# Patient Record
Sex: Female | Born: 1995
Health system: Southern US, Community
[De-identification: ages and names within clinical notes are randomized; demographics above are authoritative.]

---

## 2013-06-15 ENCOUNTER — Other Ambulatory Visit: Payer: Self-pay | Admitting: Allergy and Immunology

## 2013-06-15 ENCOUNTER — Ambulatory Visit
Admission: RE | Admit: 2013-06-15 | Discharge: 2013-06-15 | Disposition: A | Discharge status: Home / Self Care | Payer: BC Managed Care – PPO | Source: Ambulatory Visit | Attending: Allergy and Immunology | Admitting: Allergy and Immunology

## 2013-06-15 DIAGNOSIS — J329 Chronic sinusitis, unspecified: Secondary | ICD-10-CM

## 2013-06-15 DIAGNOSIS — R059 Cough, unspecified: Secondary | ICD-10-CM

## 2013-06-15 DIAGNOSIS — R05 Cough: Secondary | ICD-10-CM

## 2014-01-07 ENCOUNTER — Ambulatory Visit: Payer: Self-pay | Admitting: Unknown Physician Specialty

## 2014-05-23 LAB — SURGICAL PATHOLOGY

## 2015-04-05 ENCOUNTER — Other Ambulatory Visit: Payer: Self-pay | Admitting: Ophthalmology

## 2015-04-05 DIAGNOSIS — H538 Other visual disturbances: Secondary | ICD-10-CM

## 2015-04-05 DIAGNOSIS — R519 Headache, unspecified: Secondary | ICD-10-CM

## 2015-04-05 DIAGNOSIS — R51 Headache: Secondary | ICD-10-CM

## 2015-04-06 ENCOUNTER — Other Ambulatory Visit: Payer: Self-pay | Admitting: Ophthalmology

## 2015-04-06 ENCOUNTER — Ambulatory Visit
Admission: RE | Admit: 2015-04-06 | Discharge: 2015-04-06 | Disposition: A | Discharge status: Home / Self Care | Payer: BLUE CROSS/BLUE SHIELD | Source: Ambulatory Visit | Attending: Ophthalmology | Admitting: Ophthalmology

## 2015-04-06 ENCOUNTER — Ambulatory Visit: Admission: RE | Admit: 2015-04-06 | Payer: BLUE CROSS/BLUE SHIELD | Source: Ambulatory Visit

## 2015-04-06 DIAGNOSIS — R51 Headache: Secondary | ICD-10-CM

## 2015-04-06 DIAGNOSIS — R519 Headache, unspecified: Secondary | ICD-10-CM

## 2015-04-06 DIAGNOSIS — H538 Other visual disturbances: Secondary | ICD-10-CM

## 2015-04-06 MED ORDER — GADOBENATE DIMEGLUMINE 529 MG/ML IV SOLN
20.0000 mL | Freq: Once | INTRAVENOUS | Status: AC | PRN
  Administered 2015-04-06: 20 mL via INTRAVENOUS

## 2015-04-21 ENCOUNTER — Other Ambulatory Visit: Payer: Self-pay

## 2015-04-21 ENCOUNTER — Ambulatory Visit: Payer: Self-pay

## 2015-11-07 DIAGNOSIS — Z23 Encounter for immunization: Secondary | ICD-10-CM | POA: Diagnosis not present

## 2016-01-19 DIAGNOSIS — L700 Acne vulgaris: Secondary | ICD-10-CM | POA: Diagnosis not present

## 2016-01-19 DIAGNOSIS — Z3202 Encounter for pregnancy test, result negative: Secondary | ICD-10-CM | POA: Diagnosis not present

## 2016-07-07 IMAGING — MR MR HEAD WO/W CM
10 series · 42 of 48 positions shown · IV contrast (multihance)
Comparison: None available.

CLINICAL DATA: Initial evaluation for acute onset headache, blurry
vision.

EXAM:
MRI HEAD WITHOUT AND WITH CONTRAST
TECHNIQUE: Multiplanar, multiecho pulse sequences of the brain and surrounding
structures were obtained without and with intravenous contrast.
CONTRAST:  20mL MULTIHANCE GADOBENATE DIMEGLUMINE 529 MG/ML IV SOLN

[Series 2: t1_se_sag · sagittal · 5.0mm · 0.45mm/px · 2 of 21 slices shown]
[im 1/21]
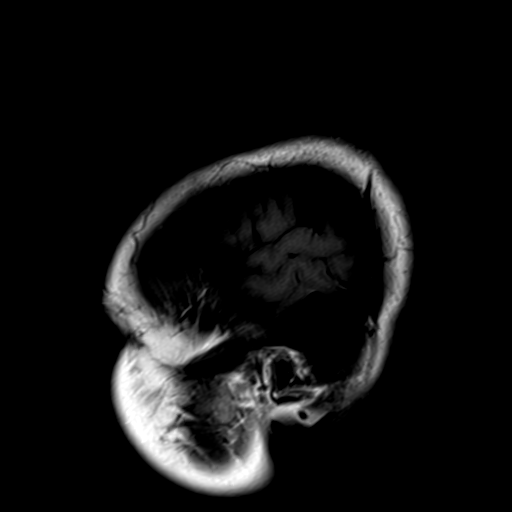
[im 21/21]
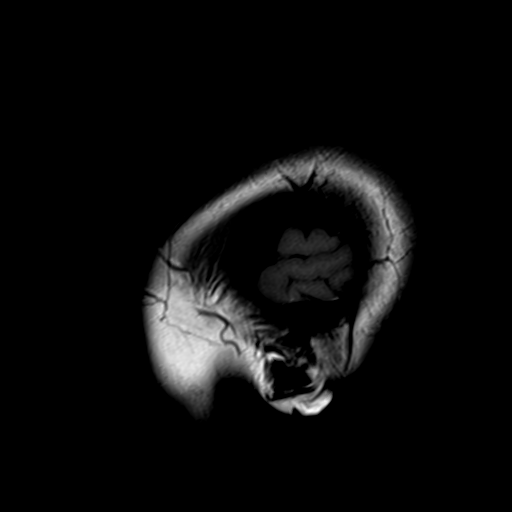

[Series 3: ep2d_diff_(id)_trace · axial · 3.0mm · 1.88mm/px · z∈[-83,+63]mm · 9 of 100 slices shown]
[im 1/100]
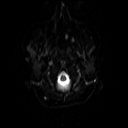
[im 13/100]
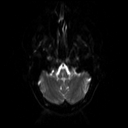
[im 25/100]
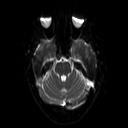
[im 38/100]
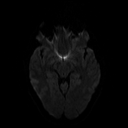
[im 50/100]
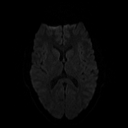
[im 62/100]
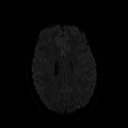
[im 75/100]
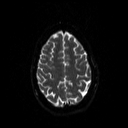
[im 87/100]
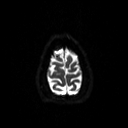
[im 100/100]
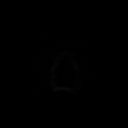

[Series 4: ep2d_diff_(id)_trace_adc · axial · 3.0mm · 1.88mm/px · z∈[-83,+63]mm · 5 of 50 slices shown]
[im 1/50]
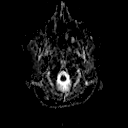
[im 13/50]
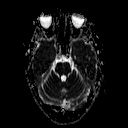
[im 25/50]
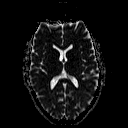
[im 37/50]
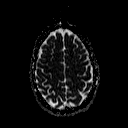
[im 50/50]
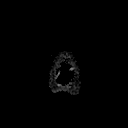

[Series 6: swi_images · axial · 2.0mm · 0.94mm/px · z∈[-88,+69]mm · 8 of 80 slices shown]
[im 1/80]
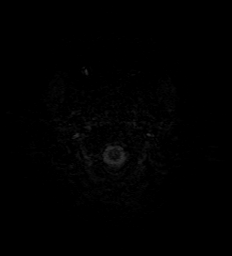
[im 12/80]
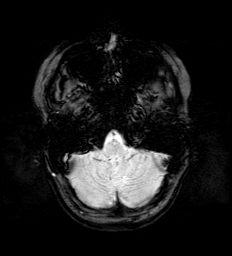
[im 23/80]
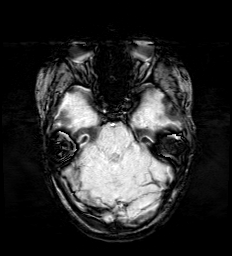
[im 34/80]
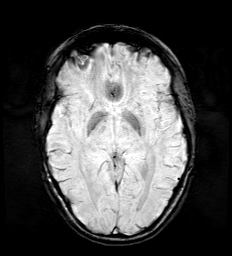
[im 46/80]
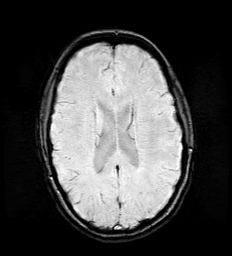
[im 57/80]
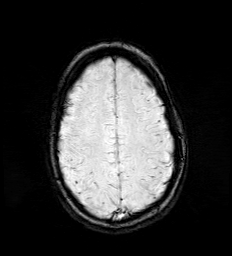
[im 68/80]
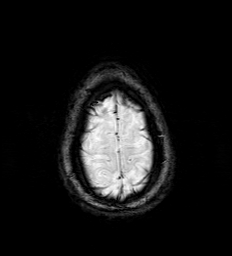
[im 80/80]
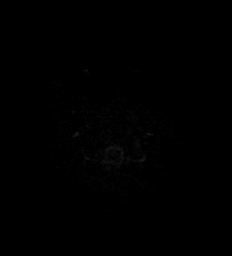

[Series 7: FLAIR · axial · 5.0mm · 0.47mm/px · z∈[-79,+59]mm · 2 of 23 slices shown]
[im 1/23]
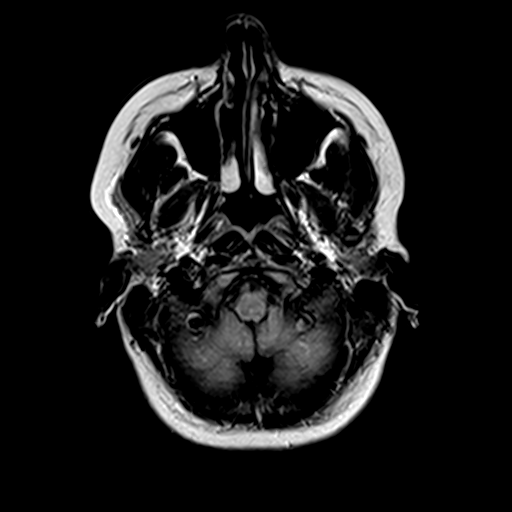
[im 23/23]
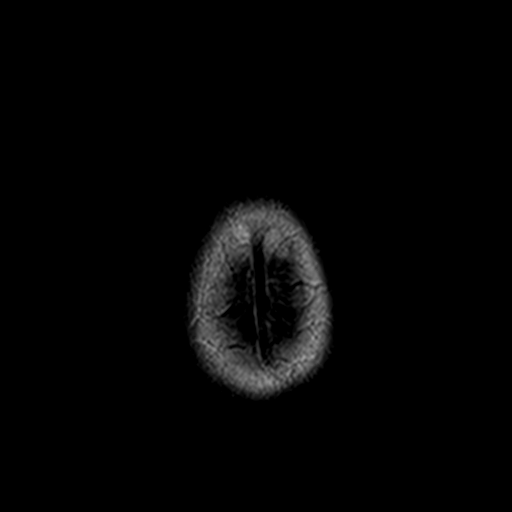

[Series 8: t2_tse_tra_512 · axial · 5.0mm · 0.62mm/px · z∈[-77,+60]mm · 2 of 23 slices shown]
[im 1/23]
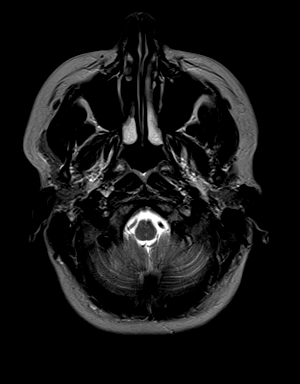
[im 23/23]
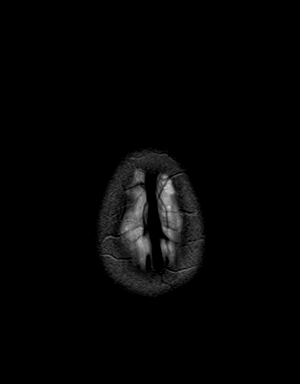

[Series 9: t1_mpr_tra · axial · 2.0mm · 0.47mm/px · z∈[-88,+70]mm · 8 of 80 slices shown]
[im 1/80]
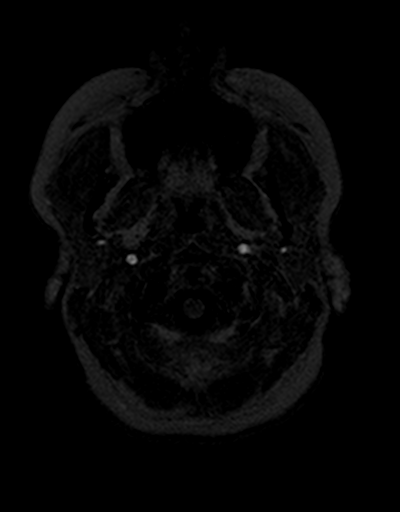
[im 12/80]
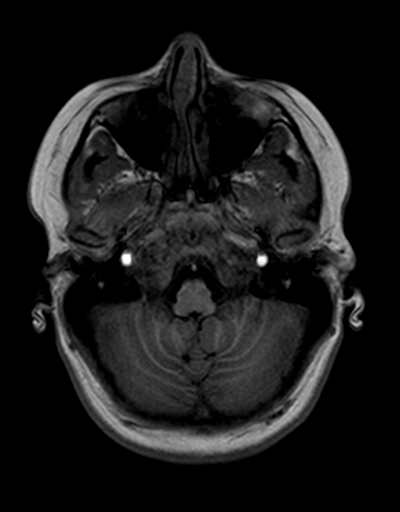
[im 23/80]
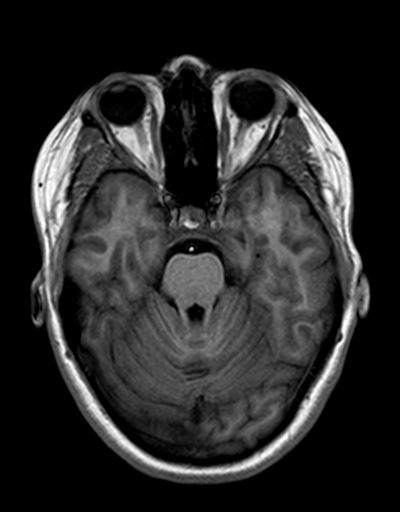
[im 34/80]
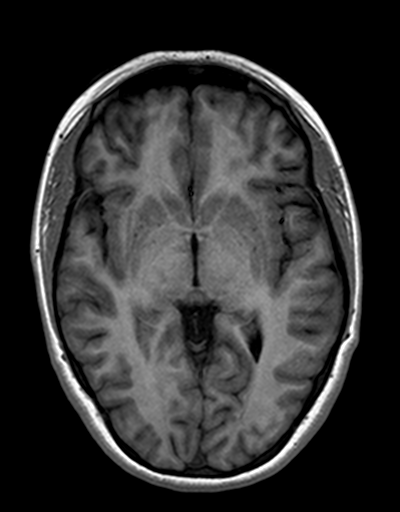
[im 46/80]
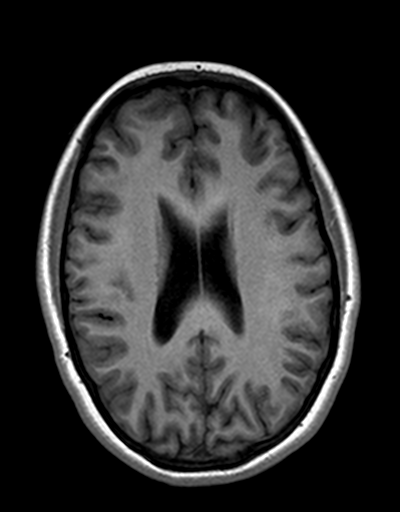
[im 57/80]
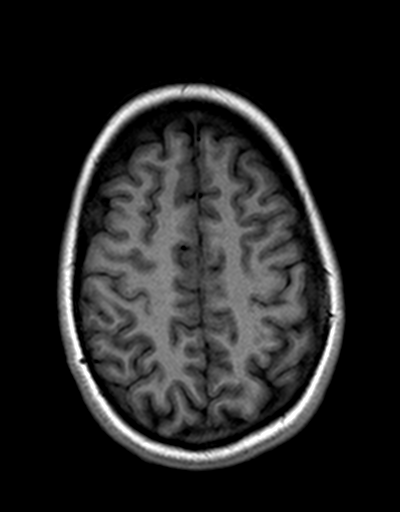
[im 68/80]
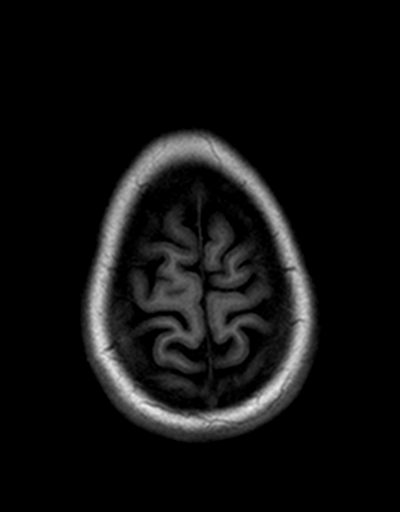
[im 80/80]
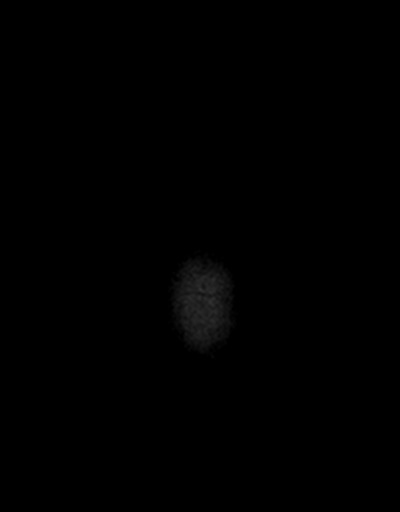

[Series 10: T2 · coronal · 5.0mm · 0.45mm/px · 2 of 25 slices shown]
[im 1/25]
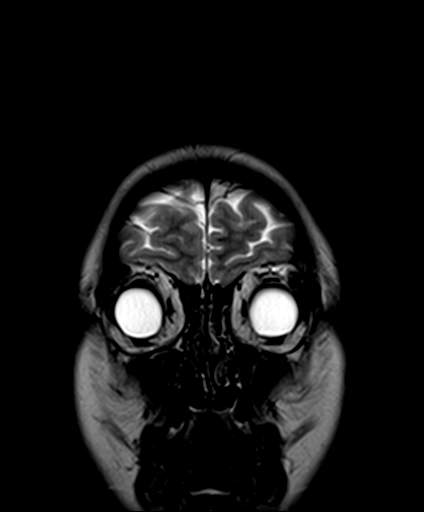
[im 25/25]
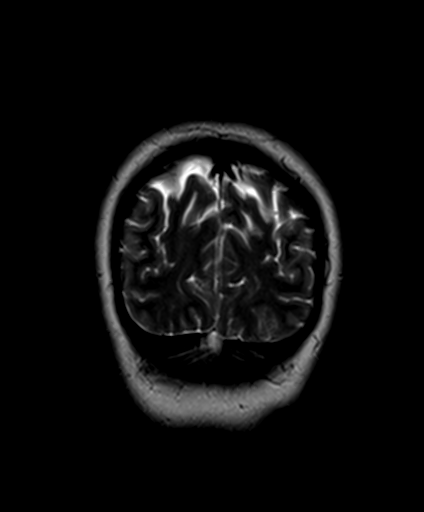

[Series 11: post cor · coronal · 5.0mm · 0.72mm/px · 2 of 25 slices shown]
[im 1/25]
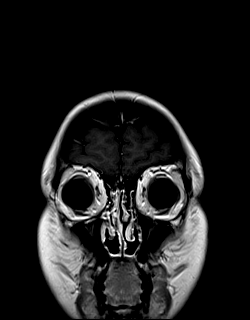
[im 25/25]
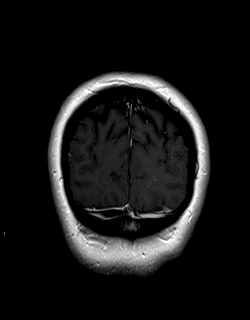

[Series 12: post t1_mpr_tra · axial · 2.0mm · 0.47mm/px · z∈[-88,-66]mm · 2 of 80 slices shown]
[im 1/80]
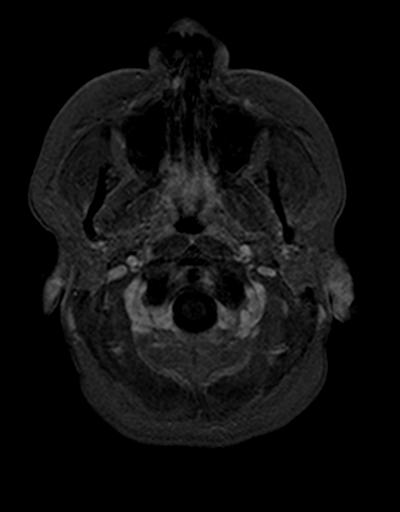
[im 12/80]
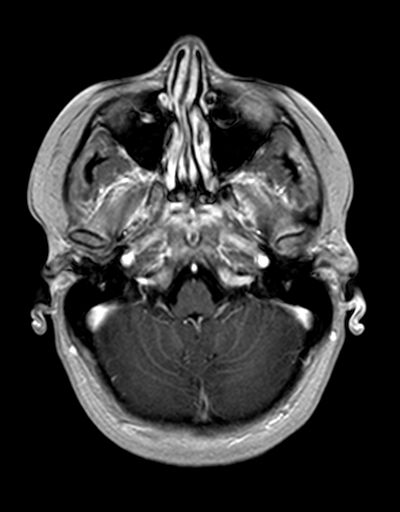

[42 of 48 positions shown; findings below may reference images not displayed]

FINDINGS: The CSF containing spaces are within normal limits for patient age.
No focal parenchymal signal abnormality is identified. No mass
lesion, midline shift, or extra-axial fluid collection. Ventricles
are normal in size without evidence of hydrocephalus.

No diffusion-weighted signal abnormality is identified to suggest
acute intracranial infarct. Gray-white matter differentiation is
maintained. Normal flow voids are seen within the intracranial
vasculature. No intracranial hemorrhage identified.

The cervicomedullary junction is normal. Pituitary gland is within
normal limits. Pituitary stalk is midline. The globes and optic
nerves demonstrate a normal appearance with normal signal intensity.

No abnormal enhancement.

The bone marrow signal intensity is normal. Calvarium is intact.
Visualized upper cervical spine is within normal limits.

Scalp soft tissues are unremarkable.

Mild polypoid mucosal thickening within the inferior maxillary
sinuses. Paranasal sinuses are otherwise clear. No mastoid effusion.
Inner ear structures normal.
IMPRESSION: Normal MRI of the brain.

## 2016-12-25 DIAGNOSIS — L700 Acne vulgaris: Secondary | ICD-10-CM | POA: Diagnosis not present

## 2016-12-26 ENCOUNTER — Encounter: Payer: Self-pay | Admitting: Internal Medicine

## 2016-12-26 ENCOUNTER — Ambulatory Visit: Payer: BLUE CROSS/BLUE SHIELD | Admitting: Internal Medicine

## 2016-12-26 DIAGNOSIS — Z0000 Encounter for general adult medical examination without abnormal findings: Secondary | ICD-10-CM | POA: Diagnosis not present

## 2016-12-26 DIAGNOSIS — Z920 Personal history of contraception: Secondary | ICD-10-CM

## 2016-12-26 NOTE — Patient Instructions (Signed)
Good to meet you! Don't forget to send me a copy of the labs your office did recently  I'll see you in a year,  Sooner if needed   Health Maintenance, Female Adopting a healthy lifestyle and getting preventive care can go a long way to promote health and wellness. Talk with your health care provider about what schedule of regular examinations is right for you. This is a good chance for you to check in with your provider about disease prevention and staying healthy. In between checkups, there are plenty of things you can do on your own. Experts have done a lot of research about which lifestyle changes and preventive measures are most likely to keep you healthy. Ask your health care provider for more information. Weight and diet Eat a healthy diet  Be sure to include plenty of vegetables, fruits, low-fat dairy products, and lean protein.  Do not eat a lot of foods high in solid fats, added sugars, or salt.  Get regular exercise. This is one of the most important things you can do for your health. ? Most adults should exercise for at least 150 minutes each week. The exercise should increase your heart rate and make you sweat (moderate-intensity exercise). ? Most adults should also do strengthening exercises at least twice a week. This is in addition to the moderate-intensity exercise.  Maintain a healthy weight  Body mass index (BMI) is a measurement that can be used to identify possible weight problems. It estimates body fat based on height and weight. Your health care provider can help determine your BMI and help you achieve or maintain a healthy weight.  For females 64 years of age and older: ? A BMI below 18.5 is considered underweight. ? A BMI of 18.5 to 24.9 is normal. ? A BMI of 25 to 29.9 is considered overweight. ? A BMI of 30 and above is considered obese.  Watch levels of cholesterol and blood lipids  You should start having your blood tested for lipids and cholesterol at 22  years of age, then have this test every 5 years.  You may need to have your cholesterol levels checked more often if: ? Your lipid or cholesterol levels are high. ? You are older than 23 years of age. ? You are at high risk for heart disease.  Cancer screening Lung Cancer  Lung cancer screening is recommended for adults 72-5 years old who are at high risk for lung cancer because of a history of smoking.  A yearly low-dose CT scan of the lungs is recommended for people who: ? Currently smoke. ? Have quit within the past 15 years. ? Have at least a 30-pack-year history of smoking. A pack year is smoking an average of one pack of cigarettes a day for 1 year.  Yearly screening should continue until it has been 15 years since you quit.  Yearly screening should stop if you develop a health problem that would prevent you from having lung cancer treatment.  Breast Cancer  Practice breast self-awareness. This means understanding how your breasts normally appear and feel.  It also means doing regular breast self-exams. Let your health care provider know about any changes, no matter how small.  If you are in your 20s or 30s, you should have a clinical breast exam (CBE) by a health care provider every 1-3 years as part of a regular health exam.  If you are 8 or older, have a CBE every year. Also consider having a  breast X-ray (mammogram) every year.  If you have a family history of breast cancer, talk to your health care provider about genetic screening.  If you are at high risk for breast cancer, talk to your health care provider about having an MRI and a mammogram every year.  Breast cancer gene (BRCA) assessment is recommended for women who have family members with BRCA-related cancers. BRCA-related cancers include: ? Breast. ? Ovarian. ? Tubal. ? Peritoneal cancers.  Results of the assessment will determine the need for genetic counseling and BRCA1 and BRCA2 testing.  Cervical  Cancer Your health care provider may recommend that you be screened regularly for cancer of the pelvic organs (ovaries, uterus, and vagina). This screening involves a pelvic examination, including checking for microscopic changes to the surface of your cervix (Pap test). You may be encouraged to have this screening done every 3 years, beginning at age 103.  For women ages 32-65, health care providers may recommend pelvic exams and Pap testing every 3 years, or they may recommend the Pap and pelvic exam, combined with testing for human papilloma virus (HPV), every 5 years. Some types of HPV increase your risk of cervical cancer. Testing for HPV may also be done on women of any age with unclear Pap test results.  Other health care providers may not recommend any screening for nonpregnant women who are considered low risk for pelvic cancer and who do not have symptoms. Ask your health care provider if a screening pelvic exam is right for you.  If you have had past treatment for cervical cancer or a condition that could lead to cancer, you need Pap tests and screening for cancer for at least 20 years after your treatment. If Pap tests have been discontinued, your risk factors (such as having a new sexual partner) need to be reassessed to determine if screening should resume. Some women have medical problems that increase the chance of getting cervical cancer. In these cases, your health care provider may recommend more frequent screening and Pap tests.  Colorectal Cancer  This type of cancer can be detected and often prevented.  Routine colorectal cancer screening usually begins at 23 years of age and continues through 23 years of age.  Your health care provider may recommend screening at an earlier age if you have risk factors for colon cancer.  Your health care provider may also recommend using home test kits to check for hidden blood in the stool.  A small camera at the end of a tube can be used to  examine your colon directly (sigmoidoscopy or colonoscopy). This is done to check for the earliest forms of colorectal cancer.  Routine screening usually begins at age 65.  Direct examination of the colon should be repeated every 5-10 years through 23 years of age. However, you may need to be screened more often if early forms of precancerous polyps or small growths are found.  Skin Cancer  Check your skin from head to toe regularly.  Tell your health care provider about any new moles or changes in moles, especially if there is a change in a mole's shape or color.  Also tell your health care provider if you have a mole that is larger than the size of a pencil eraser.  Always use sunscreen. Apply sunscreen liberally and repeatedly throughout the day.  Protect yourself by wearing long sleeves, pants, a wide-brimmed hat, and sunglasses whenever you are outside.  Heart disease, diabetes, and high blood pressure  High  blood pressure causes heart disease and increases the risk of stroke. High blood pressure is more likely to develop in: ? People who have blood pressure in the high end of the normal range (130-139/85-89 mm Hg). ? People who are overweight or obese. ? People who are African American.  If you are 30-64 years of age, have your blood pressure checked every 3-5 years. If you are 29 years of age or older, have your blood pressure checked every year. You should have your blood pressure measured twice-once when you are at a hospital or clinic, and once when you are not at a hospital or clinic. Record the average of the two measurements. To check your blood pressure when you are not at a hospital or clinic, you can use: ? An automated blood pressure machine at a pharmacy. ? A home blood pressure monitor.  If you are between 37 years and 32 years old, ask your health care provider if you should take aspirin to prevent strokes.  Have regular diabetes screenings. This involves taking a  blood sample to check your fasting blood sugar level. ? If you are at a normal weight and have a low risk for diabetes, have this test once every three years after 23 years of age. ? If you are overweight and have a high risk for diabetes, consider being tested at a younger age or more often. Preventing infection Hepatitis B  If you have a higher risk for hepatitis B, you should be screened for this virus. You are considered at high risk for hepatitis B if: ? You were born in a country where hepatitis B is common. Ask your health care provider which countries are considered high risk. ? Your parents were born in a high-risk country, and you have not been immunized against hepatitis B (hepatitis B vaccine). ? You have HIV or AIDS. ? You use needles to inject street drugs. ? You live with someone who has hepatitis B. ? You have had sex with someone who has hepatitis B. ? You get hemodialysis treatment. ? You take certain medicines for conditions, including cancer, organ transplantation, and autoimmune conditions.  Hepatitis C  Blood testing is recommended for: ? Everyone born from [birth year] through 1967. ? Anyone with known risk factors for hepatitis C.  Sexually transmitted infections (STIs)  You should be screened for sexually transmitted infections (STIs) including gonorrhea and chlamydia if: ? You are sexually active and are younger than 23 years of age. ? You are older than 23 years of age and your health care provider tells you that you are at risk for this type of infection. ? Your sexual activity has changed since you were last screened and you are at an increased risk for chlamydia or gonorrhea. Ask your health care provider if you are at risk.  If you do not have HIV, but are at risk, it may be recommended that you take a prescription medicine daily to prevent HIV infection. This is called pre-exposure prophylaxis (PrEP). You are considered at risk if: ? You are sexually active and  do not regularly use condoms or know the HIV status of your partner(s). ? You take drugs by injection. ? You are sexually active with a partner who has HIV.  Talk with your health care provider about whether you are at high risk of being infected with HIV. If you choose to begin PrEP, you should first be tested for HIV. You should then be tested every 3 months for  as long as you are taking PrEP. Pregnancy  If you are premenopausal and you may become pregnant, ask your health care provider about preconception counseling.  If you may become pregnant, take 400 to 800 micrograms (mcg) of folic acid every day.  If you want to prevent pregnancy, talk to your health care provider about birth control (contraception). Osteoporosis and menopause  Osteoporosis is a disease in which the bones lose minerals and strength with aging. This can result in serious bone fractures. Your risk for osteoporosis can be identified using a bone density scan.  If you are 56 years of age or older, or if you are at risk for osteoporosis and fractures, ask your health care provider if you should be screened.  Ask your health care provider whether you should take a calcium or vitamin D supplement to lower your risk for osteoporosis.  Menopause may have certain physical symptoms and risks.  Hormone replacement therapy may reduce some of these symptoms and risks. Talk to your health care provider about whether hormone replacement therapy is right for you. Follow these instructions at home:  Schedule regular health, dental, and eye exams.  Stay current with your immunizations.  Do not use any tobacco products including cigarettes, chewing tobacco, or electronic cigarettes.  If you are pregnant, do not drink alcohol.  If you are breastfeeding, limit how much and how often you drink alcohol.  Limit alcohol intake to no more than 1 drink per day for nonpregnant women. One drink equals 12 ounces of beer, 5 ounces of  wine, or 1 ounces of hard liquor.  Do not use street drugs.  Do not share needles.  Ask your health care provider for help if you need support or information about quitting drugs.  Tell your health care provider if you often feel depressed.  Tell your health care provider if you have ever been abused or do not feel safe at home. This information is not intended to replace advice given to you by your health care provider. Make sure you discuss any questions you have with your health care provider. Document Released: 07/30/2010 Document Revised: 06/22/2015 Document Reviewed: 10/18/2014 Elsevier Interactive Patient Education  [employer].

## 2016-12-26 NOTE — Progress Notes (Signed)
Subjective:  Patient ID: Barbara Barker, female    DOB: 1995/09/15  Age: 21 y.o. MRN: [id number]  CC: Diagnoses of History of oral contraceptive use and Encounter for preventive health examination were pertinent to this visit.  HPI Barbara Jabsnne K Croston presents for ESTABLISHMENT OF CARE and ANNUAL PREVENTIVE EXAM.  .  She was referred by Hyman Bowereena Koury.  She has no complaints today.    She has gained weight since graduation from college and has joined [employer] within hte last week for management .  Diet and need for regular exercise reviewed.   She takes birth control for management of acne and is not sexually active    History of Ophthalmology workup in  March 2017 for evaluation of  Unilateral visual disturbance.  Had MRI  Brain was normal,  Normal retinal exam.    Declines flu vaccine   History Thurston Holenne has no past medical history on file.   She has no past surgical history on file.   Her family history includes Arthritis in her maternal grandmother, mother, and paternal grandmother; Colon cancer in her other; Diabetes in her other and other; Heart disease in her paternal grandfather; Hyperlipidemia in her father; Hypertension in her maternal grandmother.She reports that  has never smoked. she has never used smokeless tobacco. She reports that she does not drink alcohol or use drugs.  Outpatient Medications Prior to Visit  Medication Sig Dispense Refill  . spironolactone (ALDACTONE) 50 MG tablet   4  . TRI-SPRINTEC 0.18/0.215/0.25 MG-35 MCG tablet   0   No facility-administered medications prior to visit.     Review of Systems:  Patient denies headache, fevers, malaise, unintentional weight loss, skin rash, eye pain, sinus congestion and sinus pain, sore throat, dysphagia,  hemoptysis , cough, dyspnea, wheezing, chest pain, palpitations, orthopnea, edema, abdominal pain, nausea, melena, diarrhea, constipation, flank pain, dysuria, hematuria, urinary  Frequency, nocturia, numbness,  tingling, seizures,  Focal weakness, Loss of consciousness,  Tremor, insomnia, depression, anxiety, and suicidal ideation.     Objective:  BP 116/78 (BP Location: Left Arm, Patient Position: Sitting, Cuff Size: Normal)   Pulse 68   Temp 98.3 F (36.8 C) (Oral)   Resp 17   Ht 5' 9.5" (1.765 m)   Wt 207 lb (93.9 kg)   SpO2 98%   BMI 30.13 kg/m   Physical Exam:  General appearance: alert, cooperative and appears stated age Ears: normal TM's and external ear canals both ears Throat: lips, mucosa, and tongue normal; teeth and gums normal Neck: no adenopathy, no carotid bruit, supple, symmetrical, trachea midline and thyroid not enlarged, symmetric, no tenderness/mass/nodules Back: symmetric, no curvature. ROM normal. No CVA tenderness. Lungs: clear to auscultation bilaterally Heart: regular rate and rhythm, S1, S2 normal, no murmur, click, rub or gallop Abdomen: soft, non-tender; bowel sounds normal; no masses,  no organomegaly Pulses: 2+ and symmetric Skin: Skin color, texture, turgor normal. No rashes or lesions Lymph nodes: Cervical, supraclavicular, and axillary nodes normal.   Assessment & Plan:   Problem List Items Addressed This Visit    Encounter for preventive health examination    Annual comprehensive exam was done  During initial visit .  During the course of the visit the patient was educated and counseled about appropriate screening and preventive services and screenings were brought up to date for cervical and breast cancer .  She  Is not sexually active and has no FH of breast cancer.  She has had recent  fasting labs  which will be provided and reviewed.  Labs included (per patient) diabetes screening and lipid analysis ,  nutrition counseling, skin cancer screening has been recommended, along with review of the age appropriate recommended immunizations.  Printed recommendations for health maintenance screenings was given.        History of oral contraceptive use     Patient screened for history of DVTs, tobacco abuse.  Use of pill reviewed  and need for annual monitoring of liver enzyes as well as barrier protection to prevent STDS, and for prevention of contraception  during periods of concurrent use of antibiotics.  No results found for: ALT, AST, GGT, ALKPHOS, BILITOT           I am having Davie K. Rubye OaksDailey maintain her TRI-SPRINTEC and spironolactone.  No orders of the defined types were placed in this encounter.   There are no discontinued medications.  Follow-up: No Follow-up on file.   Sherlene Shamseresa L Tullo, MD

## 2016-12-29 ENCOUNTER — Encounter: Payer: Self-pay | Admitting: Internal Medicine

## 2016-12-29 DIAGNOSIS — Z920 Personal history of contraception: Secondary | ICD-10-CM | POA: Insufficient documentation

## 2016-12-29 DIAGNOSIS — Z0000 Encounter for general adult medical examination without abnormal findings: Secondary | ICD-10-CM | POA: Insufficient documentation

## 2016-12-29 NOTE — Assessment & Plan Note (Addendum)
Patient screened for history of DVTs, tobacco abuse.  Use of pill reviewed  and need for annual monitoring of liver enzyes as well as barrier protection to prevent STDS, and for prevention of contraception  during periods of concurrent use of antibiotics.  No results found for: ALT, AST, GGT, ALKPHOS, BILITOT

## 2016-12-29 NOTE — Assessment & Plan Note (Signed)
Annual comprehensive exam was done  During initial visit .  During the course of the visit the patient was educated and counseled about appropriate screening and preventive services and screenings were brought up to date for cervical and breast cancer .  She  Is not sexually active and has no FH of breast cancer.  She has had recent  fasting labs which will be provided and reviewed.  Labs included (per patient) diabetes screening and lipid analysis ,  nutrition counseling, skin cancer screening has been recommended, along with review of the age appropriate recommended immunizations.  Printed recommendations for health maintenance screenings was given.

## 2017-10-06 ENCOUNTER — Telehealth: Payer: Self-pay

## 2017-10-06 NOTE — Telephone Encounter (Signed)
Left detailed message per DPR that patient will need an appointment to clarify need for standup desk, patient has only been seen once and over 8 months ago.

## 2017-10-06 NOTE — Telephone Encounter (Signed)
Pt would a like a letter from PCP so she can get a stand up desk at work.

## 2017-10-06 NOTE — Telephone Encounter (Signed)
Copied from CRM [id number]. Topic: Inquiry >> Oct 06, 2017  1:34 PM Alexander Bergeron B wrote: Reason for CRM: pt called b/c she is needing a note from her pcp to show her employer in order for her to get a standup desk; contact to advise

## 2017-11-19 ENCOUNTER — Encounter: Payer: Self-pay | Admitting: Internal Medicine

## 2017-11-19 ENCOUNTER — Ambulatory Visit: Payer: BLUE CROSS/BLUE SHIELD | Admitting: Internal Medicine

## 2017-11-19 VITALS — BP 110/68 | HR 57 | Temp 98.1°F | Resp 14 | Ht 69.25 in | Wt 193.0 lb

## 2017-11-19 DIAGNOSIS — Z920 Personal history of contraception: Secondary | ICD-10-CM | POA: Diagnosis not present

## 2017-11-19 DIAGNOSIS — Z23 Encounter for immunization: Secondary | ICD-10-CM | POA: Diagnosis not present

## 2017-11-19 DIAGNOSIS — E663 Overweight: Secondary | ICD-10-CM

## 2017-11-19 DIAGNOSIS — R5383 Other fatigue: Secondary | ICD-10-CM | POA: Diagnosis not present

## 2017-11-19 MED ORDER — TRI-SPRINTEC 0.18/0.215/0.25 MG-35 MCG PO TABS: 1.0000 | ORAL_TABLET | Freq: Every day | ORAL | 11 refills | Status: DC

## 2017-11-19 NOTE — Progress Notes (Signed)
Patient ID: Barbara Barker, female    DOB: 1996/01/27  Age: 22 y.o. MRN: [id number]  The patient is here for annual  wellness examination and management of other chronic and acute problems.   The risk factors are reflected in the social history.  The roster of all physicians providing medical care to patient - is listed in the Snapshot section of the chart.  Activities of daily living:  The patient is 100% independent in all ADLs: dressing, toileting, feeding as well as independent mobility  Home safety : The patient has smoke detectors in the home. They wear seatbelts.  There are no firearms at home. There is no violence in the home.   There is no risks for hepatitis, STDs or HIV. There is no   history of blood transfusion. They have no travel history to infectious disease endemic areas of the world.  The patient has seen their dentist in the last six month. They have seen their eye doctor in the last year.   They do not  have excessive sun exposure. Discussed the need for sun protection: hats, long sleeves and use of sunscreen if there is significant sun exposure.   Diet: the importance of a healthy diet is discussed. They do have a healthy diet.  The benefits of regular aerobic exercise were discussed. She is not exercising regularly .   Depression screen: there are no signs or vegative symptoms of depression- irritability, change in appetite, anhedonia, sadness/tearfullness.   The following portions of the patient's history were reviewed and updated as appropriate: allergies, current medications, past family history, past medical history,  past surgical history, past social history  and problem list.  Visual acuity was not assessed per patient preference since she has regular follow up with her ophthalmologist. Hearing and body mass index were assessed and reviewed.   During the course of the visit the patient was educated and counseled about appropriate screening and preventive  services including :  nutrition counseling,  and recommended immunizations.    She is not sexually active.   CC: The primary encounter diagnosis was Fatigue, unspecified type. Diagnoses of Need for immunization against influenza, Overweight, History of oral contraceptive use, and Overweight (BMI 25.0-29.9) were also pertinent to this visit.  1) request for a Veri Desk  2) refill on Tri Sprintec which she is using for management of acne.   History Barbara Barker has no past medical history on file.   She has no past surgical history on file.   Her family history includes Arthritis in her maternal grandmother, mother, and paternal grandmother; Colon cancer in her other; Dementia in her maternal grandfather; Diabetes in her other and other; Heart disease in her paternal grandfather; Hyperlipidemia in her father; Hypertension in her maternal grandmother.She reports that she has never smoked. She has never used smokeless tobacco. She reports that she drinks about 1.0 standard drinks of alcohol per week. She reports that she does not use drugs.  Outpatient Medications Prior to Visit  Medication Sig Dispense Refill  . TRI-SPRINTEC 0.18/0.215/0.25 MG-35 MCG tablet   0  . spironolactone (ALDACTONE) 50 MG tablet   4   No facility-administered medications prior to visit.     Review of Systems   Patient denies headache, fevers, malaise, unintentional weight loss, skin rash, eye pain, sinus congestion and sinus pain, sore throat, dysphagia,  hemoptysis , cough, dyspnea, wheezing, chest pain, palpitations, orthopnea, edema, abdominal pain, nausea, melena, diarrhea, constipation, flank pain, dysuria, hematuria, urinary  Frequency,  nocturia, numbness, tingling, seizures,  Focal weakness, Loss of consciousness,  Tremor, insomnia, depression, anxiety, and suicidal ideation.      Objective:  BP 110/68 (BP Location: Left Arm, Patient Position: Sitting, Cuff Size: Normal)   Pulse (!) 57   Temp 98.1 F (36.7 C)  (Oral)   Resp 14   Ht 5' 9.25" (1.759 m)   Wt 193 lb (87.5 kg)   SpO2 98%   BMI 28.30 kg/m   Physical Exam   General appearance: alert, cooperative and appears stated age Ears: normal TM's and external ear canals both ears Throat: lips, mucosa, and tongue normal; teeth and gums normal Neck: no adenopathy, no carotid bruit, supple, symmetrical, trachea midline and thyroid not enlarged, symmetric, no tenderness/mass/nodules Back: symmetric, no curvature. ROM normal. No CVA tenderness. Lungs: clear to auscultation bilaterally Heart: regular rate and rhythm, S1, S2 normal, no murmur, click, rub or gallop Abdomen: soft, non-tender; bowel sounds normal; no masses,  no organomegaly Pulses: 2+ and symmetric Skin: Skin color, texture, turgor normal. No rashes or lesions Lymph nodes: Cervical, supraclavicular, and axillary nodes normal.   Assessment & Plan:   Problem List Items Addressed This Visit    History of oral contraceptive use    Patient screened for history of DVTs, tobacco abuse.  Use of pill reviewed  and need for annual monitoring of liver enzyes as well as barrier protection to prevent STDS, and for prevention of contraception  during periods of concurrent use of antibiotics.       Overweight (BMI 25.0-29.9)    I have congratulated her in reduction of   BMI and encouraged  Continued weight loss with goal of 10% of body weigh over the next 6 months using a low glycemic index diet and regular exercise a minimum of 5 days per week.         Other Visit Diagnoses    Fatigue, unspecified type    -  Primary   Relevant Orders   Comprehensive metabolic panel   TSH   CBC with Differential/Platelet   Need for immunization against influenza       Relevant Orders   Flu Vaccine QUAD 36+ mos IM (Completed)   Overweight       Relevant Orders   Hemoglobin A1c   Lipid panel     A total of 25 minutes of face to face time was spent with patient more than half of which was spent in  counselling about the above mentioned conditions  and coordination of care    I have discontinued Liliana K. Arcia's spironolactone. I have also changed her TRI-SPRINTEC.  Meds ordered this encounter  Medications  . TRI-SPRINTEC 0.18/0.215/0.25 MG-35 MCG tablet    Sig: Take 1 tablet by mouth daily.    Dispense:  1 Package    Refill:  11    Medications Discontinued During This Encounter  Medication Reason  . spironolactone (ALDACTONE) 50 MG tablet Patient has not taken in last 30 days  . TRI-SPRINTEC 0.18/0.215/0.25 MG-35 MCG tablet Reorder    Follow-up: No follow-ups on file.   Sherlene Shams, MD

## 2017-11-19 NOTE — Assessment & Plan Note (Signed)
Patient screened for history of DVTs, tobacco abuse.  Use of pill reviewed  and need for annual monitoring of liver enzyes as well as barrier protection to prevent STDS, and for prevention of contraception  during periods of concurrent use of antibiotics.

## 2017-11-19 NOTE — Patient Instructions (Signed)

## 2017-11-19 NOTE — Assessment & Plan Note (Signed)
I have congratulated her in reduction of   BMI and encouraged  Continued weight loss with goal of 10% of body weigh over the next 6 months using a low glycemic index diet and regular exercise a minimum of 5 days per week.    

## 2017-11-20 LAB — LIPID PANEL
CHOL/HDL RATIO: 2
Cholesterol: 149 mg/dL (ref 0–200)
HDL: 62.3 mg/dL (ref >39.00)
LDL CALC: 74 mg/dL (ref 0–99)
NONHDL: 86.76
Triglycerides: 65 mg/dL (ref 0.0–149.0)
VLDL: 13 mg/dL (ref 0.0–40.0)

## 2017-11-20 LAB — CBC WITH DIFFERENTIAL/PLATELET
Basophils Absolute: 0.1 10*3/uL (ref 0.0–0.1)
Basophils Relative: 1 % (ref 0.0–3.0)
EOS ABS: 0.1 10*3/uL (ref 0.0–0.7)
EOS PCT: 0.9 % (ref 0.0–5.0)
HEMATOCRIT: 41.1 % (ref 36.0–46.0)
HEMOGLOBIN: 14.4 g/dL (ref 12.0–15.0)
LYMPHS PCT: 34.9 % (ref 12.0–46.0)
Lymphs Abs: 2.9 10*3/uL (ref 0.7–4.0)
MCHC: 34.9 g/dL (ref 30.0–36.0)
MCV: 94.5 fl (ref 78.0–100.0)
Monocytes Absolute: 0.5 10*3/uL (ref 0.1–1.0)
Monocytes Relative: 5.4 % (ref 3.0–12.0)
NEUTROS ABS: 4.8 10*3/uL (ref 1.4–7.7)
Neutrophils Relative %: 57.8 % (ref 43.0–77.0)
PLATELETS: 294 10*3/uL (ref 150.0–400.0)
RBC: 4.35 Mil/uL (ref 3.87–5.11)
RDW: 11.6 % (ref 11.5–15.5)
WBC: 8.3 10*3/uL (ref 4.0–10.5)

## 2017-11-20 LAB — COMPREHENSIVE METABOLIC PANEL
ALT: 15 U/L (ref 0–35)
AST: 19 U/L (ref 0–37)
Albumin: 4.7 g/dL (ref 3.5–5.2)
Alkaline Phosphatase: 54 U/L (ref 39–117)
BILIRUBIN TOTAL: 0.8 mg/dL (ref 0.2–1.2)
BUN: 12 mg/dL (ref 6–23)
CO2: 23 mEq/L (ref 19–32)
Calcium: 9.6 mg/dL (ref 8.4–10.5)
Chloride: 104 mEq/L (ref 96–112)
Creatinine, Ser: 0.8 mg/dL (ref 0.40–1.20)
GFR: 93.68 mL/min (ref >60.00)
GLUCOSE: 65 mg/dL — AB (ref 70–99)
Potassium: 3.9 mEq/L (ref 3.5–5.1)
Sodium: 139 mEq/L (ref 135–145)
Total Protein: 7.4 g/dL (ref 6.0–8.3)

## 2017-11-20 LAB — HEMOGLOBIN A1C: Hgb A1c MFr Bld: 5.2 % (ref 4.6–6.5)

## 2017-11-20 LAB — TSH: TSH: 1.3 u[IU]/mL (ref 0.35–4.50)

## 2018-08-10 DIAGNOSIS — L700 Acne vulgaris: Secondary | ICD-10-CM | POA: Diagnosis not present

## 2018-09-25 ENCOUNTER — Other Ambulatory Visit: Payer: Self-pay | Admitting: Internal Medicine

## 2019-02-26 DIAGNOSIS — F4323 Adjustment disorder with mixed anxiety and depressed mood: Secondary | ICD-10-CM | POA: Diagnosis not present

## 2019-03-17 DIAGNOSIS — F4323 Adjustment disorder with mixed anxiety and depressed mood: Secondary | ICD-10-CM | POA: Diagnosis not present

## 2019-07-26 ENCOUNTER — Encounter: Payer: Self-pay | Admitting: Internal Medicine

## 2019-07-26 ENCOUNTER — Other Ambulatory Visit (HOSPITAL_COMMUNITY)
Admission: RE | Admit: 2019-07-26 | Discharge: 2019-07-26 | Disposition: A | Discharge status: Home / Self Care | Payer: BC Managed Care – PPO | Source: Ambulatory Visit | Attending: Internal Medicine | Admitting: Internal Medicine

## 2019-07-26 ENCOUNTER — Other Ambulatory Visit: Payer: Self-pay

## 2019-07-26 ENCOUNTER — Ambulatory Visit (INDEPENDENT_AMBULATORY_CARE_PROVIDER_SITE_OTHER): Payer: BC Managed Care – PPO | Admitting: Internal Medicine

## 2019-07-26 VITALS — BP 106/72 | HR 88 | Temp 98.5°F | Resp 14 | Ht 69.25 in | Wt 187.0 lb

## 2019-07-26 DIAGNOSIS — Z0000 Encounter for general adult medical examination without abnormal findings: Secondary | ICD-10-CM

## 2019-07-26 DIAGNOSIS — Z920 Personal history of contraception: Secondary | ICD-10-CM | POA: Diagnosis not present

## 2019-07-26 DIAGNOSIS — Z124 Encounter for screening for malignant neoplasm of cervix: Secondary | ICD-10-CM | POA: Diagnosis not present

## 2019-07-26 DIAGNOSIS — E663 Overweight: Secondary | ICD-10-CM | POA: Diagnosis not present

## 2019-07-26 DIAGNOSIS — Z113 Encounter for screening for infections with a predominantly sexual mode of transmission: Secondary | ICD-10-CM

## 2019-07-26 DIAGNOSIS — R634 Abnormal weight loss: Secondary | ICD-10-CM

## 2019-07-26 NOTE — Assessment & Plan Note (Signed)

## 2019-07-26 NOTE — Assessment & Plan Note (Signed)
She is not sexually active.  First PAP smear was done today cervix is retroverted

## 2019-07-26 NOTE — Progress Notes (Signed)
Patient ID: Barbara Barker, female    DOB: November 16, 1995  Age: 24 y.o. MRN: [id number]  The patient is here for annual PREVENTIVE  examination and management of other chronic and acute problems.   The risk factors are reflected in the social history.  The roster of all physicians providing medical care to patient - is listed in the Snapshot section of the chart.  Activities of daily living:  The patient is 100% independent in all ADLs: dressing, toileting, feeding as well as independent mobility  Home safety : The patient has smoke detectors in the home. They wear seatbelts.  There are no firearms at home. There is no violence in the home.   There is no risks for hepatitis, STDs or HIV. There is no   history of blood transfusion. They have no travel history to infectious disease endemic areas of the world.  The patient has seen their dentist in the last six month. They have seen their eye doctor in the last year. They admit to slight hearing difficulty with regard to whispered voices and some television programs.  They have deferred audiologic testing in the last year.  They do not  have excessive sun exposure. Discussed the need for sun protection: hats, long sleeves and use of sunscreen if there is significant sun exposure.   Diet: the importance of a healthy diet is discussed. They do have a healthy diet.  The benefits of regular aerobic exercise were discussed. She walks 4 times per week ,  20 minutes.   Depression screen: there are no signs or vegative symptoms of depression- irritability, change in appetite, anhedonia, sadness/tearfullness.  Cognitive assessment: the patient manages all their financial and personal affairs and is actively engaged. They could relate day,date,year and events; recalled 2/3 objects at 3 minutes; performed clock-face test normally.  The following portions of the patient's history were reviewed and updated as appropriate: allergies, current medications, past  family history, past medical history,  past surgical history, past social history  and problem list.  Visual acuity was not assessed per patient preference since she has regular follow up with her ophthalmologist. Hearing and body mass index were assessed and reviewed.   During the course of the visit the patient was educated and counseled about appropriate screening and preventive services including : fall prevention , diabetes screening, nutrition counseling, colorectal cancer screening, and recommended immunizations.    CC: The primary encounter diagnosis was Cervical cancer screening. Diagnoses of Overweight (BMI 25.0-29.9), Encounter for preventive health examination, History of oral contraceptive use, Screen for STD (sexually transmitted disease), Weight loss, and Encounter for Papanicolaou smear for cervical cancer screening were also pertinent to this visit.  1) HAD MENSES THAT LASTED 3 WEEKS ,  HAS BEEN NORMAL SINCE THEN    History Barbara Barker has no past medical history on file.   She has no past surgical history on file.   Her family history includes Arthritis in her maternal grandmother, mother, and paternal grandmother; Colon cancer in an other family member; Dementia in her maternal grandfather; Diabetes in some other family members; Heart disease in her paternal grandfather; Hyperlipidemia in her father; Hypertension in her maternal grandmother.She reports that she has never smoked. She has never used smokeless tobacco. She reports current alcohol use of about 1.0 standard drink of alcohol per week. She reports that she does not use drugs.  Outpatient Medications Prior to Visit  Medication Sig Dispense Refill  . spironolactone (ALDACTONE) 50 MG tablet Take 50 mg  by mouth 2 (two) times daily.    . TRI-PREVIFEM 0.18/0.215/0.25 MG-35 MCG tablet TAKE 1 TABLET BY MOUTH DAILY 1 Package 0   No facility-administered medications prior to visit.    Review of Systems   Patient denies  headache, fevers, malaise, unintentional weight loss, skin rash, eye pain, sinus congestion and sinus pain, sore throat, dysphagia,  hemoptysis , cough, dyspnea, wheezing, chest pain, palpitations, orthopnea, edema, abdominal pain, nausea, melena, diarrhea, constipation, flank pain, dysuria, hematuria, urinary  Frequency, nocturia, numbness, tingling, seizures,  Focal weakness, Loss of consciousness,  Tremor, insomnia, depression, anxiety, and suicidal ideation.      Objective:  BP 106/72 (BP Location: Left Arm, Patient Position: Sitting, Cuff Size: Normal)   Pulse 88   Temp 98.5 F (36.9 C) (Temporal)   Resp 14   Ht 5' 9.25" (1.759 m)   Wt 187 lb (84.8 kg)   SpO2 98%   BMI 27.42 kg/m   Physical Exam  General Appearance:    Alert, cooperative, no distress, appears stated age  Head:    Normocephalic, without obvious abnormality, atraumatic  Eyes:    PERRL, conjunctiva/corneas clear, EOM's intact, fundi    benign, both eyes  Ears:    Normal TM's and external ear canals, both ears  Nose:   Nares normal, septum midline, mucosa normal, no drainage    or sinus tenderness  Throat:   Lips, mucosa, and tongue normal; teeth and gums normal  Neck:   Supple, symmetrical, trachea midline, no adenopathy;    thyroid:  no enlargement/tenderness/nodules; no carotid   bruit or JVD  Back:     Symmetric, no curvature, ROM normal, no CVA tenderness  Lungs:     Clear to auscultation bilaterally, respirations unlabored  Chest Wall:    No tenderness or deformity   Heart:    Regular rate and rhythm, S1 and S2 normal, no murmur, rub   or gallop  Breast Exam:    No tenderness, masses, or nipple abnormality  Abdomen:     Soft, non-tender, bowel sounds active all four quadrants,    no masses, no organomegaly  Genitalia:    Pelvic: cervix normal in appearance, external genitalia normal, no adnexal masses or tenderness, no cervical motion tenderness, rectovaginal septum normal, uterus normal size, shape, and  consistency and vagina normal without discharge  Extremities:   Extremities normal, atraumatic, no cyanosis or edema  Pulses:   2+ and symmetric all extremities  Skin:   Skin color, texture, turgor normal, no rashes or lesions  Lymph nodes:   Cervical, supraclavicular, and axillary nodes normal  Neurologic:   CNII-XII intact, normal strength, sensation and reflexes    throughout     Assessment & Plan:   Problem List Items Addressed This Visit      Unprioritized   Overweight (BMI 25.0-29.9)    I have congratulated her in reduction of   BMI  FROM 30 TO 27 OVER THE LAST 3 YEARS , and encouraged  Continued weight loss with goal of 10% of body weigh over the next 6 months using a low glycemic index diet and regular exercise a minimum of 5 days per week.        History of oral contraceptive use   Encounter for preventive health examination    age appropriate education and counseling updated, referrals for preventative services and immunizations addressed, dietary and smoking counseling addressed, most recent labs reviewed.  I have personally reviewed and have noted:  1) the patient's medical  and social history 2) The pt's use of alcohol, tobacco, and illicit drugs 3) The patient's current medications and supplements 4) Functional ability including ADL's, fall risk, home safety risk, hearing and visual impairment 5) Diet and physical activities 6) Evidence for depression or mood disorder 7) The patient's height, weight, and BMI have been recorded in the chart  I have made referrals, and provided counseling and education based on review of the above      Encounter for Papanicolaou smear for cervical cancer screening    She is not sexually active.  First PAP smear was done today cervix is retroverted        Other Visit Diagnoses    Cervical cancer screening    -  Primary   Relevant Orders   Cytology - PAP( )   Screen for STD (sexually transmitted disease)       Weight  loss          I am having Barbara Barker maintain her Tri-Previfem and spironolactone.  No orders of the defined types were placed in this encounter.   There are no discontinued medications.  Follow-up: No follow-ups on file.   Sherlene Shams, MD

## 2019-07-26 NOTE — Assessment & Plan Note (Signed)
I have congratulated her in reduction of   BMI  FROM 30 TO 27 OVER THE LAST 3 YEARS , and encouraged  Continued weight loss with goal of 10% of body weigh over the next 6 months using a low glycemic index diet and regular exercise a minimum of 5 days per week.

## 2019-07-26 NOTE — Patient Instructions (Signed)
GOOD TO SEE YOU!    congrats on the ongoing weight loss!    YOUR PAP SMEAR RESULTS WILL BE AVAILABLE IN 4-5 DAYS   I RECOMMEND GETTING AN ANNUAL EYE EXAM  SEND ME YOUR LABS FROM YOUR [facility] SCREENING EVENT   Consider using Refresh or Systane eye drops throughout the day if you are staring at a screen continually to prevent dry eye   Health Maintenance, Female Adopting a healthy lifestyle and getting preventive care are important in promoting health and wellness. Ask your health care provider about:  The right schedule for you to have regular tests and exams.  Things you can do on your own to prevent diseases and keep yourself healthy. What should I know about diet, weight, and exercise? Eat a healthy diet   Eat a diet that includes plenty of vegetables, fruits, low-fat dairy products, and lean protein.  Do not eat a lot of foods that are high in solid fats, added sugars, or sodium. Maintain a healthy weight Body mass index (BMI) is used to identify weight problems. It estimates body fat based on height and weight. Your health care provider can help determine your BMI and help you achieve or maintain a healthy weight. Get regular exercise Get regular exercise. This is one of the most important things you can do for your health. Most adults should:  Exercise for at least 150 minutes each week. The exercise should increase your heart rate and make you sweat (moderate-intensity exercise).  Do strengthening exercises at least twice a week. This is in addition to the moderate-intensity exercise.  Spend less time sitting. Even light physical activity can be beneficial. Watch cholesterol and blood lipids Have your blood tested for lipids and cholesterol at 26 years of age, then have this test every 5 years. Have your cholesterol levels checked more often if:  Your lipid or cholesterol levels are high.  You are older than 26 years of age.  You are at high risk for heart  disease. What should I know about cancer screening? Depending on your health history and family history, you may need to have cancer screening at various ages. This may include screening for:  Breast cancer.  Cervical cancer.  Colorectal cancer.  Skin cancer.  Lung cancer. What should I know about heart disease, diabetes, and high blood pressure? Blood pressure and heart disease  High blood pressure causes heart disease and increases the risk of stroke. This is more likely to develop in people who have high blood pressure readings, are of African descent, or are overweight.  Have your blood pressure checked: ? Every 3-5 years if you are 59-54 years of age. ? Every year if you are 33 years old or older. Diabetes Have regular diabetes screenings. This checks your fasting blood sugar level. Have the screening done:  Once every three years after age 2 if you are at a normal weight and have a low risk for diabetes.  More often and at a younger age if you are overweight or have a high risk for diabetes. What should I know about preventing infection? Hepatitis B If you have a higher risk for hepatitis B, you should be screened for this virus. Talk with your health care provider to find out if you are at risk for hepatitis B infection. Hepatitis C Testing is recommended for:  Everyone born from [birth year] through 1967.  Anyone with known risk factors for hepatitis C. Sexually transmitted infections (STIs)  Get screened for STIs,  including gonorrhea and chlamydia, if: ? You are sexually active and are younger than 26 years of age. ? You are older than 26 years of age and your health care provider tells you that you are at risk for this type of infection. ? Your sexual activity has changed since you were last screened, and you are at increased risk for chlamydia or gonorrhea. Ask your health care provider if you are at risk.  Ask your health care provider about whether you are at high  risk for HIV. Your health care provider may recommend a prescription medicine to help prevent HIV infection. If you choose to take medicine to prevent HIV, you should first get tested for HIV. You should then be tested every 3 months for as long as you are taking the medicine. Pregnancy  If you are about to stop having your period (premenopausal) and you may become pregnant, seek counseling before you get pregnant.  Take 400 to 800 micrograms (mcg) of folic acid every day if you become pregnant.  Ask for birth control (contraception) if you want to prevent pregnancy. Osteoporosis and menopause Osteoporosis is a disease in which the bones lose minerals and strength with aging. This can result in bone fractures. If you are 74 years old or older, or if you are at risk for osteoporosis and fractures, ask your health care provider if you should:  Be screened for bone loss.  Take a calcium or vitamin D supplement to lower your risk of fractures.  Be given hormone replacement therapy (HRT) to treat symptoms of menopause. Follow these instructions at home: Lifestyle  Do not use any products that contain nicotine or tobacco, such as cigarettes, e-cigarettes, and chewing tobacco. If you need help quitting, ask your health care provider.  Do not use street drugs.  Do not share needles.  Ask your health care provider for help if you need support or information about quitting drugs. Alcohol use  Do not drink alcohol if: ? Your health care provider tells you not to drink. ? You are pregnant, may be pregnant, or are planning to become pregnant.  If you drink alcohol: ? Limit how much you use to 0-1 drink a day. ? Limit intake if you are breastfeeding.  Be aware of how much alcohol is in your drink. In the [place] one drink equals one 12 oz bottle of beer (355 mL), one 5 oz glass of wine (148 mL), or one 1 oz glass of hard liquor (44 mL). General instructions  Schedule regular health, dental,  and eye exams.  Stay current with your vaccines.  Tell your health care provider if: ? You often feel depressed. ? You have ever been abused or do not feel safe at home. Summary  Adopting a healthy lifestyle and getting preventive care are important in promoting health and wellness.  Follow your health care provider's instructions about healthy diet, exercising, and getting tested or screened for diseases.  Follow your health care provider's instructions on monitoring your cholesterol and blood pressure. This information is not intended to replace advice given to you by your health care provider. Make sure you discuss any questions you have with your health care provider. Document Revised: 01/07/2018 Document Reviewed: 01/07/2018 Elsevier Patient Education  2020 [employer].

## 2019-07-27 LAB — CYTOLOGY - PAP
Adequacy: ABSENT
Diagnosis: NEGATIVE

## 2020-01-24 ENCOUNTER — Telehealth: Payer: Self-pay | Admitting: Internal Medicine

## 2020-01-24 MED ORDER — NORGESTIM-ETH ESTRAD TRIPHASIC 0.18/0.215/0.25 MG-35 MCG PO TABS: 1.0000 | ORAL_TABLET | Freq: Every day | ORAL | 3 refills | Status: AC

## 2020-01-24 NOTE — Telephone Encounter (Signed)
Pt needs a refill on TRI-PREVIFEM 0.18/0.215/0.25 MG-35 MCG tablet sent to [facility]

## 2020-02-16 DIAGNOSIS — L700 Acne vulgaris: Secondary | ICD-10-CM | POA: Diagnosis not present

## 2020-08-30 DIAGNOSIS — Z0000 Encounter for general adult medical examination without abnormal findings: Secondary | ICD-10-CM | POA: Diagnosis not present

## 2020-08-30 DIAGNOSIS — A09 Infectious gastroenteritis and colitis, unspecified: Secondary | ICD-10-CM | POA: Diagnosis not present

## 2020-08-30 DIAGNOSIS — Z13220 Encounter for screening for lipoid disorders: Secondary | ICD-10-CM | POA: Diagnosis not present

## 2020-09-13 DIAGNOSIS — A09 Infectious gastroenteritis and colitis, unspecified: Secondary | ICD-10-CM | POA: Diagnosis not present

## 2020-09-13 DIAGNOSIS — D510 Vitamin B12 deficiency anemia due to intrinsic factor deficiency: Secondary | ICD-10-CM | POA: Diagnosis not present

## 2020-12-13 DIAGNOSIS — L700 Acne vulgaris: Secondary | ICD-10-CM | POA: Diagnosis not present

## 2022-09-18 ENCOUNTER — Telehealth: Payer: Self-pay | Admitting: Internal Medicine

## 2022-09-18 NOTE — Telephone Encounter (Signed)
Patient last seen in 2021- left voicemail to schedule annual or see if patient has established care somewhere else
